# Patient Record
Sex: Male | Born: 1995 | Race: White | Hispanic: No | Marital: Single | Smoking: Never smoker
Health system: Southern US, Community
[De-identification: ages and names within clinical notes are randomized; demographics above are authoritative.]

---
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
## 2015-05-22 ENCOUNTER — Encounter: Payer: Self-pay | Admitting: *Deleted

## 2015-05-22 ENCOUNTER — Ambulatory Visit
Admission: EM | Admit: 2015-05-22 | Discharge: 2015-05-22 | Disposition: A | Discharge status: Home / Self Care | Payer: BC Managed Care – PPO | Attending: Family Medicine | Admitting: Family Medicine

## 2015-05-22 DIAGNOSIS — J029 Acute pharyngitis, unspecified: Secondary | ICD-10-CM

## 2015-05-22 LAB — MONONUCLEOSIS SCREEN: Mono Screen: NEGATIVE

## 2015-05-22 LAB — CBC WITH DIFFERENTIAL/PLATELET
BASOS ABS: 0.1 10*3/uL (ref 0–0.1)
Basophils Relative: 1 %
Eosinophils Absolute: 0.1 10*3/uL (ref 0–0.7)
Eosinophils Relative: 2 %
HEMATOCRIT: 46.5 % (ref 40.0–52.0)
HEMOGLOBIN: 15.8 g/dL (ref 13.0–18.0)
LYMPHS PCT: 34 %
Lymphs Abs: 2 10*3/uL (ref 1.0–3.6)
MCH: 30.1 pg (ref 26.0–34.0)
MCHC: 34.1 g/dL (ref 32.0–36.0)
MCV: 88.5 fL (ref 80.0–100.0)
MONO ABS: 1.3 10*3/uL — AB (ref 0.2–1.0)
MONOS PCT: 23 %
NEUTROS ABS: 2.3 10*3/uL (ref 1.4–6.5)
NEUTROS PCT: 40 %
Platelets: 194 10*3/uL (ref 150–440)
RBC: 5.25 MIL/uL (ref 4.40–5.90)
RDW: 13.2 % (ref 11.5–14.5)
WBC: 5.7 10*3/uL (ref 3.8–10.6)

## 2015-05-22 LAB — RAPID INFLUENZA A&B ANTIGENS
Influenza A (ARMC): NOT DETECTED
Influenza B (ARMC): NOT DETECTED

## 2015-05-22 LAB — RAPID STREP SCREEN (MED CTR MEBANE ONLY): STREPTOCOCCUS, GROUP A SCREEN (DIRECT): NEGATIVE

## 2015-05-22 MED ORDER — OSELTAMIVIR PHOSPHATE 75 MG PO CAPS: 75.0000 mg | ORAL_CAPSULE | Freq: Two times a day (BID) | ORAL | Status: DC

## 2015-05-22 NOTE — ED Provider Notes (Signed)
CSN: [id number]     Arrival date & time 05/22/15  1042 History   First MD Initiated Contact with Patient 05/22/15 1152     Chief Complaint  Patient presents with  . Sore Throat  . Headache   (Consider location/radiation/quality/duration/timing/severity/associated sxs/prior Treatment) HPI: Patient is unsafe symptoms of sore throat and headache. Patient states that he had a fever yesterday of 102. He also has some myalgias. He denies any cough, chest pain, shortness of breath, abdominal pain. He denies any recent travel. He has not taken any medications today and is afebrile in the office. He denies any nausea vomiting or diarrhea or rashes.  History reviewed. No pertinent past medical history. History reviewed. No pertinent past surgical history. History reviewed. No pertinent family history. Social History  Substance Use Topics  . Smoking status: Never Smoker   . Smokeless tobacco: Never Used  . Alcohol Use: No    Review of Systems: Negative except mentioned.   Allergies  Review of patient's allergies indicates no known allergies.  Home Medications   Prior to Admission medications   Not on File   Meds Ordered and Administered this Visit  Medications - No data to display  BP 131/83 mmHg  Pulse 57  Temp(Src) 98.2 F (36.8 C) (Oral)  Resp 18  Ht  (1.727 m)  Wt 138 lb (62.596 kg)  BMI 20.99 kg/m2  SpO2 100% No data found.   Physical Exam   GENERAL: NAD HEENT: mild pharyngeal erythema, no exudate, no erythema of TMs, mild cervical LAD RESP: CTA B CARD: RRR ABD: +BS, NT/ND, no rebound or guarding NEURO: CN II-XII grossly intact   ED Course  Procedures (including critical care time)  Labs Review Labs Reviewed  RAPID INFLUENZA A&B ANTIGENS ([facility] ONLY)  RAPID STREP SCREEN (NOT AT [facility])  CULTURE, GROUP A STREP ([facility])  CBC WITH DIFFERENTIAL/PLATELET  MONONUCLEOSIS SCREEN    Imaging Review No results found.    MDM  A/P: Pharyngitis, Viral Illness-  rapid strep test was negative, flu screen was negative, mono test was negative, CBC appears to show likely viral process. We'll send him a flu to the pharmacy only if patient has symptoms this evening should he start that prescription. Tylenol/Motrin when necessary, Claritin when necessary, rest, hydration, he can return to work tomorrow as long as he doesn't have fever. Seek medical attention if symptoms persist or worsen.    Jolene Provost, MD 05/22/15 1316

## 2015-05-22 NOTE — ED Notes (Signed)
Patient started having symptoms of sore throat and headache yesterday. Additional symptoms of fever and body aches are present.

## 2015-05-24 LAB — CULTURE, GROUP A STREP (THRC)

## 2016-07-03 ENCOUNTER — Emergency Department
Admission: EM | Admit: 2016-07-03 | Discharge: 2016-07-03 | Disposition: A | Discharge status: Home / Self Care | Payer: BC Managed Care – PPO | Attending: Emergency Medicine | Admitting: Emergency Medicine

## 2016-07-03 ENCOUNTER — Encounter: Payer: Self-pay | Admitting: Emergency Medicine

## 2016-07-03 ENCOUNTER — Emergency Department: Payer: BC Managed Care – PPO

## 2016-07-03 DIAGNOSIS — R109 Unspecified abdominal pain: Secondary | ICD-10-CM | POA: Diagnosis present

## 2016-07-03 DIAGNOSIS — N201 Calculus of ureter: Secondary | ICD-10-CM | POA: Diagnosis not present

## 2016-07-03 LAB — COMPREHENSIVE METABOLIC PANEL
ALT: 15 U/L — AB (ref 17–63)
AST: 22 U/L (ref 15–41)
Albumin: 5.1 g/dL — ABNORMAL HIGH (ref 3.5–5.0)
Alkaline Phosphatase: 38 U/L (ref 38–126)
Anion gap: 6 (ref 5–15)
BUN: 15 mg/dL (ref 6–20)
CHLORIDE: 104 mmol/L (ref 101–111)
CO2: 31 mmol/L (ref 22–32)
CREATININE: 0.84 mg/dL (ref 0.61–1.24)
Calcium: 9.6 mg/dL (ref 8.9–10.3)
GFR calc Af Amer: >60 mL/min (ref >60)
Glucose, Bld: 92 mg/dL (ref 65–99)
POTASSIUM: 4.2 mmol/L (ref 3.5–5.1)
SODIUM: 141 mmol/L (ref 135–145)
Total Bilirubin: 1.1 mg/dL (ref 0.3–1.2)
Total Protein: 7.6 g/dL (ref 6.5–8.1)

## 2016-07-03 LAB — URINALYSIS, COMPLETE (UACMP) WITH MICROSCOPIC
BACTERIA UA: NONE SEEN
BILIRUBIN URINE: NEGATIVE
Glucose, UA: NEGATIVE mg/dL
HGB URINE DIPSTICK: NEGATIVE
KETONES UR: NEGATIVE mg/dL
LEUKOCYTES UA: NEGATIVE
Nitrite: NEGATIVE
PROTEIN: NEGATIVE mg/dL
SQUAMOUS EPITHELIAL / LPF: NONE SEEN
Specific Gravity, Urine: 1.021 (ref 1.005–1.030)
pH: 6 (ref 5.0–8.0)

## 2016-07-03 LAB — CBC
HCT: 46 % (ref 40.0–52.0)
Hemoglobin: 16.1 g/dL (ref 13.0–18.0)
MCH: 30.7 pg (ref 26.0–34.0)
MCHC: 35.1 g/dL (ref 32.0–36.0)
MCV: 87.4 fL (ref 80.0–100.0)
PLATELETS: 222 10*3/uL (ref 150–440)
RBC: 5.26 MIL/uL (ref 4.40–5.90)
RDW: 13.1 % (ref 11.5–14.5)
WBC: 14 10*3/uL — AB (ref 3.8–10.6)

## 2016-07-03 LAB — LIPASE, BLOOD: LIPASE: 12 U/L (ref 11–51)

## 2016-07-03 MED ORDER — NAPROXEN 500 MG PO TABS: 500.0000 mg | ORAL_TABLET | Freq: Two times a day (BID) | ORAL | 2 refills | Status: DC

## 2016-07-03 MED ORDER — SODIUM CHLORIDE 0.9 % IV SOLN
1000.0000 mL | Freq: Once | INTRAVENOUS | Status: AC
  Administered 2016-07-03: 1000 mL via INTRAVENOUS

## 2016-07-03 MED ORDER — KETOROLAC TROMETHAMINE 30 MG/ML IJ SOLN
30.0000 mg | Freq: Once | INTRAMUSCULAR | Status: AC
  Administered 2016-07-03: 30 mg via INTRAVENOUS
  Filled 2016-07-03: qty 1

## 2016-07-03 NOTE — ED Triage Notes (Signed)
[facility]. Pt c/o left flank pain that started around 0930 this morning.  Pt states pain also in abdomen but unable to narrow down location.  Denies any dysuria or hematuria.  Reports amber colored urine. 10/10 pain.

## 2016-07-03 NOTE — ED Notes (Signed)
ED Provider at bedside. 

## 2016-07-03 NOTE — ED Provider Notes (Signed)
[facility] Emergency Department Provider Note   ____________________________________________    I have reviewed the triage vital signs and the nursing notes.   HISTORY  Chief Complaint Flank Pain     HPI Mark Turner is a 21 y.o. male who presents with complaints of left flank pain. Patient reports that approximately 9:30 this morning he developed dull left flank pain which quickly became very sharp and extremely painful. He was unable to sit still and had to stand up and notes that he was sweaty and nauseous. He has had this once before several months ago but it went away on its own. No history of kidney stones. No fevers or chills. No dysuria. He reports the pain is still there but not as severe. Father has a history of kidney stones   History reviewed. No pertinent past medical history.  There are no active problems to display for this patient.   History reviewed. No pertinent surgical history.  Prior to Admission medications   Medication Sig Start Date End Date Taking? Authorizing Provider  naproxen (NAPROSYN) 500 MG tablet Take 1 tablet (500 mg total) by mouth 2 (two) times daily with a meal. 07/03/16   Jene Everyobert Alvia Tory, MD  oseltamivir (TAMIFLU) 75 MG capsule Take 1 capsule (75 mg total) by mouth every 12 (twelve) hours. 05/22/15   Jolene ProvostKirtida Patel, MD     Allergies Patient has no known allergies.  History reviewed. No pertinent family history.  Social History Social History  Substance Use Topics  . Smoking status: Never Smoker  . Smokeless tobacco: Never Used  . Alcohol use No    Review of Systems  Constitutional: No fever/chills   Cardiovascular: Denies chest pain. Respiratory: Denies shortness of breath. Gastrointestinal: As above Genitourinary: Negative for dysuria. Has not noticed blood Musculoskeletal: Negative for back pain. Skin: Negative for rash. Neurological: Negative for headache  10-point ROS otherwise  negative.  ____________________________________________   PHYSICAL EXAM:  VITAL SIGNS: ED Triage Vitals  Enc Vitals Group     BP 07/03/16 1421 140/85     Pulse Rate 07/03/16 1421 66     Resp 07/03/16 1421 18     Temp 07/03/16 1421 98.3 F (36.8 C)     Temp Source 07/03/16 1421 Oral     SpO2 07/03/16 1421 99 %     Weight 07/03/16 1421 138 lb (62.6 kg)     Height 07/03/16 1421 5\' 7"  (1.702 m)     Head Circumference --      Peak Flow --      Pain Score 07/03/16 1422 10     Pain Loc --      Pain Edu? --      Excl. in GC? --     Constitutional: Alert and oriented. No acute distress. Pleasant and interactive Eyes: Conjunctivae are normal.   Nose: No congestion/rhinnorhea. Mouth/Throat: Mucous membranes are moist.    Cardiovascular: Normal rate, regular rhythm. Grossly normal heart sounds.  Good peripheral circulation. Respiratory: Normal respiratory effort.  No retractions. Lungs CTAB. Gastrointestinal: Soft and nontender. No distention.  No CVA tenderness. Genitourinary: deferred Musculoskeletal: No lower extremity tenderness nor edema.  Warm and well perfused Neurologic:  Normal speech and language. No gross focal neurologic deficits are appreciated.  Skin:  Skin is warm, dry and intact. No rash noted. Psychiatric: Mood and affect are normal. Speech and behavior are normal.  ____________________________________________   LABS (all labs ordered are listed, but only abnormal results are displayed)  Labs Reviewed  URINALYSIS, COMPLETE (UACMP) WITH MICROSCOPIC - Abnormal; Notable for the following:       Result Value   Color, Urine YELLOW (*)    APPearance HAZY (*)    All other components within normal limits  CBC - Abnormal; Notable for the following:    WBC 14.0 (*)    All other components within normal limits  COMPREHENSIVE METABOLIC PANEL - Abnormal; Notable for the following:    Albumin 5.1 (*)    ALT 15 (*)    All other components within normal limits   LIPASE, BLOOD   ____________________________________________  EKG  None ____________________________________________  RADIOLOGY  CT renal stone study reassuring, some enlarged lymph nodes noted ____________________________________________   PROCEDURES  Procedure(s) performed: No    Critical Care performed: No ____________________________________________   INITIAL IMPRESSION / ASSESSMENT AND PLAN / ED COURSE  Pertinent labs & imaging results that were available during my care of the patient were reviewed by me and considered in my medical decision making (see chart for details).  Patient well-appearing and in no acute distress. Complains of acute onset left flank pain, strongly suspicious for kidney stone. We will check urine labs CT renal stone study and given IV Toradol and IV fluids  ----------------------------------------- 4:27 PM on 07/03/2016 -----------------------------------------  Patient feels better after treatment, pain is greatly relieved. CT scan does not demonstrate stones, mildly enlarged lymph nodes, question whether patient passed stone already as history is strongly suggestive of ureterolithiasis. Discussed CT results with family, we will treat with naproxen and recommend PCP follow-up    ____________________________________________   FINAL CLINICAL IMPRESSION(S) / ED DIAGNOSES  Final diagnoses:  Acute left flank pain  Ureterolithiasis      NEW MEDICATIONS STARTED DURING THIS VISIT:  New Prescriptions   NAPROXEN (NAPROSYN) 500 MG TABLET    Take 1 tablet (500 mg total) by mouth 2 (two) times daily with a meal.     Note:  This document was prepared using Dragon voice recognition software and may include unintentional dictation errors.    Jene Every, MD 07/03/16 [phone]

## 2020-09-30 ENCOUNTER — Emergency Department: Payer: Medicaid Other

## 2020-09-30 ENCOUNTER — Observation Stay
Admission: EM | Admit: 2020-09-30 | Discharge: 2020-10-02 | Disposition: A | Discharge status: Home / Self Care | Payer: Medicaid Other | Attending: Surgery | Admitting: Surgery

## 2020-09-30 ENCOUNTER — Other Ambulatory Visit: Payer: Self-pay

## 2020-09-30 DIAGNOSIS — F17220 Nicotine dependence, chewing tobacco, uncomplicated: Secondary | ICD-10-CM | POA: Insufficient documentation

## 2020-09-30 DIAGNOSIS — S99912A Unspecified injury of left ankle, initial encounter: Secondary | ICD-10-CM | POA: Diagnosis present

## 2020-09-30 DIAGNOSIS — Y92096 Garden or yard of other non-institutional residence as the place of occurrence of the external cause: Secondary | ICD-10-CM | POA: Insufficient documentation

## 2020-09-30 DIAGNOSIS — W1842XA Slipping, tripping and stumbling without falling due to stepping into hole or opening, initial encounter: Secondary | ICD-10-CM | POA: Insufficient documentation

## 2020-09-30 DIAGNOSIS — S82852A Displaced trimalleolar fracture of left lower leg, initial encounter for closed fracture: Secondary | ICD-10-CM | POA: Diagnosis not present

## 2020-09-30 DIAGNOSIS — Z20822 Contact with and (suspected) exposure to covid-19: Secondary | ICD-10-CM | POA: Insufficient documentation

## 2020-09-30 DIAGNOSIS — Z419 Encounter for procedure for purposes other than remedying health state, unspecified: Secondary | ICD-10-CM

## 2020-09-30 DIAGNOSIS — S9305XA Dislocation of left ankle joint, initial encounter: Secondary | ICD-10-CM

## 2020-09-30 DIAGNOSIS — S82892A Other fracture of left lower leg, initial encounter for closed fracture: Secondary | ICD-10-CM | POA: Diagnosis present

## 2020-09-30 MED ORDER — HYDROMORPHONE HCL 1 MG/ML IJ SOLN
1.0000 mg | Freq: Once | INTRAMUSCULAR | Status: AC
  Administered 2020-09-30: 1 mg via INTRAVENOUS
  Filled 2020-09-30: qty 1

## 2020-09-30 MED ORDER — LACTATED RINGERS IV BOLUS
1000.0000 mL | Freq: Once | INTRAVENOUS | Status: AC
  Administered 2020-10-01: 1000 mL via INTRAVENOUS

## 2020-09-30 MED ORDER — ETOMIDATE 2 MG/ML IV SOLN
10.0000 mg | Freq: Once | INTRAVENOUS | Status: AC
  Administered 2020-10-01: 10 mg via INTRAVENOUS
  Filled 2020-09-30: qty 10

## 2020-09-30 NOTE — ED Triage Notes (Signed)
Pt presents to ER via ems from home.  Pt was reportedly walking and stepped in a hole and fell.  When pt fell, he felt his ankle pop.  Pt has obvious deformity to left ankle noted.  Pt given 200 mcg fentanyl and 4 mg zofran en route.  Pt A&O x4 at this time.

## 2020-09-30 NOTE — ED Provider Notes (Signed)
[facility] Emergency Department Provider Note   ____________________________________________   Event Date/Time   First MD Initiated Contact with Patient 09/30/20 2335     (approximate)  I have reviewed the triage vital signs and the nursing notes.   HISTORY  Chief Complaint Ankle Pain (Left ankle deformity )    HPI Mark Turner is a 25 y.o. male brought to the ED via EMS from home status post left ankle injury.  Patient was chasing a possum in his yard and stepped in a hole, twisting his left ankle.  Presents with obvious deformity to left ankle with palpable pulses.  Given 200 mcg IV fentanyl and 4 mg IV Zofran in route to the ED by EMS.  Denies striking head or LOC.  Denies neck pain, chest pain, shortness of breath, abdominal pain, nausea, vomiting or dizziness.  Last ate dinner at 6:30 PM.     Past medical history Asthma  Patient Active Problem List   Diagnosis Date Noted   Ankle fracture, left 10/01/2020     No past surgical history on file.  Prior to Admission medications   Medication Sig Start Date End Date Taking? Authorizing Provider  Loratadine 10 MG CAPS Take 10 mg by mouth daily.    [provider]    Allergies Patient has no known allergies.  No family history on file.  Social History Social History   Tobacco Use   Smoking status: Never   Smokeless tobacco: Never  Substance Use Topics   Alcohol use: No   Drug use: No    Review of Systems  Constitutional: No fever/chills Eyes: No visual changes. ENT: No sore throat. Cardiovascular: Denies chest pain. Respiratory: Denies shortness of breath. Gastrointestinal: No abdominal pain.  No nausea, no vomiting.  No diarrhea.  No constipation. Genitourinary: Negative for dysuria. Musculoskeletal: Positive for left ankle injury.  Negative for back pain. Skin: Negative for rash. Neurological: Negative for headaches, focal weakness or  numbness.   ____________________________________________   PHYSICAL EXAM:  VITAL SIGNS: ED Triage Vitals  Enc Vitals Group     BP      Pulse      Resp      Temp      Temp src      SpO2      Weight      Height      Head Circumference      Peak Flow      Pain Score      Pain Loc      Pain Edu?      Excl. in GC?     Constitutional: Alert and oriented. Well appearing and in mild acute distress. Eyes: Conjunctivae are normal. PERRL. EOMI. Head: Atraumatic. Nose: Atraumatic. Mouth/Throat: Mucous membranes are moist.  No dental malocclusion. Neck: No stridor.   Cardiovascular: Normal rate, regular rhythm. Grossly normal heart sounds.  Good peripheral circulation. Respiratory: Normal respiratory effort.  No retractions. Lungs CTAB. Gastrointestinal: Soft and nontender to light or deep palpation. No distention. No abdominal bruits. No CVA tenderness. Musculoskeletal:  LLE: Obvious ankle deformity.  Palpable DP pulses.  Warm foot without evidence for ischemia.  Able to wiggle toes.  Brisk, less than 5-second capillary refill. Neurologic:  Normal speech and language. No gross focal neurologic deficits are appreciated.  Skin:  Skin is warm, dry and intact. No rash noted. Psychiatric: Mood and affect are normal. Speech and behavior are normal.  ____________________________________________   LABS (all labs ordered are  listed, but only abnormal results are displayed)  Labs Reviewed  CBC WITH DIFFERENTIAL/PLATELET - Abnormal; Notable for the following components:      Result Value   WBC 11.0 (*)    Neutro Abs 8.1 (*)    All other components within normal limits  BASIC METABOLIC PANEL - Abnormal; Notable for the following components:   Glucose, Bld 119 (*)    Calcium 8.5 (*)    All other components within normal limits  RESP PANEL BY RT-PCR (FLU A&B, COVID) ARPGX2  HIV ANTIBODY (ROUTINE TESTING W REFLEX)    ____________________________________________  EKG  None ____________________________________________  RADIOLOGY Tawni MillersI, Takeyla Million J, personally viewed and evaluated these images (plain radiographs) as part of my medical decision making, as well as reviewing the written report by the radiologist.  ED MD interpretation: Left ankle fracture dislocation; improved interval reduction  Official radiology report(s): DG Ankle 2 Views Left  Result Date: 10/01/2020 CLINICAL DATA:  Post reduction EXAM: LEFT ANKLE - 2 VIEW COMPARISON:  X-ray left ankle 09/30/2020 FINDINGS: Status post cast placement with improved anatomical alignment of a trimalleolar fracture. Interval reduction of the left ankle joint with proper alignment of the tibiotalar joint and tibia fibular joint. Persistent widening of the medial clear space. Subcutaneus soft tissue edema. IMPRESSION: Improved anatomical alignment of a trimalleolar fracture. Interval reduction of the left ankle joint with proper alignment of the tibiotalar joint and tibia-fibular joint but persistent widening of the medial clear space. Electronically Signed   By: Tish FredericksonMorgane  Naveau M.D.   On: 10/01/2020 01:07   DG Ankle Complete Left  Result Date: 09/30/2020 CLINICAL DATA:  Larey SeatFell into a hole.  Felt a pop.  Deformity. EXAM: LEFT ANKLE COMPLETE - 3+ VIEW COMPARISON:  None. FINDINGS: Lateral dislocation of the talus with respect to the tibia. There may also be a rotational component. Displaced medial malleolar fracture with distal fracture fragment aligned with the displaced talus. There is an angulated distal fibular shaft fracture. Disruption of the ankle mortise and syndesmosis. No visualized tibial tubercle fracture, however significant osseous overlap. Generalized soft tissue edema at the fracture site. IMPRESSION: Ankle fracture dislocation. Displaced medial malleolar and distal fibular fractures with disruption of the ankle mortise and syndesmosis. Lateral dislocation  of the talus with respect to the tibia. Electronically Signed   By: Narda RutherfordMelanie  Sanford M.D.   On: 09/30/2020 23:52    ____________________________________________   PROCEDURES  Procedure(s) performed (including Critical Care):  .Ortho Injury Treatment  Date/Time: 09/30/2020 11:47 PM Performed by: Irean HongSung, Arwyn Besaw J, MD Authorized by: Irean HongSung, Deisha Stull J, MD   Consent:    Consent obtained:  Verbal   Consent given by:  Patient   Risks discussed:  Fracture, nerve damage, restricted joint movement, vascular damage, stiffness, recurrent dislocation and irreducible dislocation   Alternatives discussed:  ImmobilizationInjury location: ankle Location details: left ankle Injury type: fracture-dislocation Pre-procedure neurovascular assessment: neurovascularly intact Pre-procedure range of motion: reduced  Patient sedated: Yes. Refer to sedation procedure documentation for details of sedation. Manipulation performed: yes Skin traction used: no Skeletal traction used: no Immobilization: splint Splint type: ankle stirrup and long leg Splint Applied by: ED Provider and [facility] [employer] Supplies used: cotton padding, elastic bandage and Ortho-Glass Post-procedure neurovascular assessment: post-procedure neurovascularly intact   .Sedation  Date/Time: 09/30/2020 11:49 PM Performed by: Irean HongSung, Kendalynn Wideman J, MD Authorized by: Irean HongSung, Ardenia Stiner J, MD   Consent:    Consent obtained:  Verbal   Consent given by:  Patient   Risks discussed:  Allergic reaction,  prolonged hypoxia resulting in organ damage, prolonged sedation necessitating reversal, dysrhythmia, inadequate sedation, respiratory compromise necessitating ventilatory assistance and intubation, nausea and vomiting   Alternatives discussed:  Analgesia without sedation Universal protocol:    Procedure explained and questions answered to patient or proxy's satisfaction: yes     Relevant documents present and verified: yes     Test results available: yes     Imaging  studies available: yes     Required blood products, implants, devices, and special equipment available: yes     Site/side marked: yes     Immediately prior to procedure, a time out was called: yes     Patient identity confirmed:  Verbally with patient and arm band Indications:    Procedure performed:  Dislocation reduction   Procedure necessitating sedation performed by:  Physician performing sedation Pre-sedation assessment:    Time since last food or drink:  1830   NPO status caution: urgency dictates proceeding with non-ideal NPO status     ASA classification: class 1 - normal, healthy patient     Mouth opening:  3 or more finger widths   Thyromental distance:  4 finger widths   Mallampati score:  I - soft palate, uvula, fauces, pillars visible   Neck mobility: normal     Pre-sedation assessments completed and reviewed: airway patency, cardiovascular function, hydration status, mental status, nausea/vomiting, pain level, respiratory function and temperature   Immediate pre-procedure details:    Reassessment: Patient reassessed immediately prior to procedure     Reviewed: vital signs, relevant labs/tests and NPO status     Verified: bag valve mask available, emergency equipment available, intubation equipment available, IV patency confirmed, oxygen available, reversal medications available and suction available   Procedure details (see MAR for exact dosages):    Preoxygenation:  Nasal cannula   Sedation:  Etomidate   Intended level of sedation: deep   Analgesia:  Hydromorphone   Intra-procedure monitoring:  Blood pressure monitoring, continuous capnometry, frequent LOC assessments, frequent vital sign checks, continuous pulse oximetry and cardiac monitor   Intra-procedure events: none     Total Provider sedation time (minutes):  29 Post-procedure details:    Post-sedation assessment completed:  10/01/2020 1:15 AM   Attendance: Constant attendance by certified staff until patient  recovered     Recovery: Patient returned to pre-procedure baseline     Patient is stable for discharge or admission: yes     Procedure completion:  Tolerated well, no immediate complications .Splint Application  Date/Time: 10/01/2020 12:40 AM Performed by: Irean Hong, MD Authorized by: Irean Hong, MD   Consent:    Consent obtained:  Verbal   Consent given by:  Patient   Risks, benefits, and alternatives were discussed: yes     Risks discussed:  Discoloration, numbness, pain and swelling Universal protocol:    Patient identity confirmed:  Verbally with patient Pre-procedure details:    Distal neurologic exam:  Normal   Distal perfusion: distal pulses strong   Procedure details:    Location:  Ankle   Splint type:  Long leg and ankle stirrup   Supplies:  Cotton padding, fiberglass and elastic bandage   Attestation: Splint applied and adjusted personally by me   Post-procedure details:    Distal neurologic exam:  Normal   Distal perfusion: distal pulses strong     Procedure completion:  Tolerated   Post-procedure imaging: reviewed     CRITICAL CARE Performed by: Irean Hong   Total critical care time: 45  minutes  Critical care time was exclusive of separately billable procedures and treating other patients.  Critical care was necessary to treat or prevent imminent or life-threatening deterioration.  Critical care was time spent personally by me on the following activities: development of treatment plan with patient and/or surrogate as well as nursing, discussions with consultants, evaluation of patient's response to treatment, examination of patient, obtaining history from patient or surrogate, ordering and performing treatments and interventions, ordering and review of laboratory studies, ordering and review of radiographic studies, pulse oximetry and re-evaluation of patient's condition.  ____________________________________________   INITIAL IMPRESSION / ASSESSMENT  AND PLAN / ED COURSE  As part of my medical decision making, I reviewed the following data within the electronic MEDICAL RECORD NUMBER History obtained from family, Nursing notes reviewed and incorporated, Labs reviewed, Old chart reviewed, Radiograph reviewed, Discussed with admitting physician, and Notes from prior ED visits     25 year old male presenting with left ankle injury.  Differential diagnosis includes but is not limited to dislocation, fracture, internal derangement, etc.  Will obtain plain film x-rays.  Obvious dislocation.  Will set up for reduction under deep sedation.  Clinical Course as of 10/01/20 0641  Tue Oct 01, 2020  0045 Patient tolerated deep sedation and reduction of fracture/dislocation well.  Will discuss with orthopedics [JS]  [phone] Discussed with orthopedics on-call Dr. Joice Lofts who will admit patient for surgery later this afternoon.  Updated patient and spouse who are agreeable with plan of care. [JS]    Clinical Course User Index [JS] Irean Hong, MD     ____________________________________________   FINAL CLINICAL IMPRESSION(S) / ED DIAGNOSES  Final diagnoses:  Closed fracture of left ankle, initial encounter  Ankle dislocation, left, initial encounter     ED Discharge Orders     None        Note:  This document was prepared using Dragon voice recognition software and may include unintentional dictation errors.    Irean Hong, MD 10/01/20 [phone]

## 2020-10-01 ENCOUNTER — Emergency Department: Payer: Medicaid Other

## 2020-10-01 ENCOUNTER — Observation Stay: Payer: Medicaid Other | Admitting: Anesthesiology

## 2020-10-01 ENCOUNTER — Observation Stay: Payer: Medicaid Other

## 2020-10-01 ENCOUNTER — Encounter: Payer: Self-pay | Admitting: Surgery

## 2020-10-01 ENCOUNTER — Encounter
Admission: EM | Disposition: A | Discharge status: Home / Self Care | Payer: Self-pay | Source: Home / Self Care | Attending: Emergency Medicine

## 2020-10-01 DIAGNOSIS — S82892A Other fracture of left lower leg, initial encounter for closed fracture: Secondary | ICD-10-CM | POA: Diagnosis present

## 2020-10-01 HISTORY — PX: ORIF ANKLE FRACTURE: SHX5408

## 2020-10-01 LAB — CBC WITH DIFFERENTIAL/PLATELET
Abs Immature Granulocytes: 0.03 10*3/uL (ref 0.00–0.07)
Basophils Absolute: 0.1 10*3/uL (ref 0.0–0.1)
Basophils Relative: 1 %
Eosinophils Absolute: 0.1 10*3/uL (ref 0.0–0.5)
Eosinophils Relative: 1 %
HCT: 41.4 % (ref 39.0–52.0)
Hemoglobin: 14.2 g/dL (ref 13.0–17.0)
Immature Granulocytes: 0 %
Lymphocytes Relative: 18 %
Lymphs Abs: 2 10*3/uL (ref 0.7–4.0)
MCH: 30.3 pg (ref 26.0–34.0)
MCHC: 34.3 g/dL (ref 30.0–36.0)
MCV: 88.3 fL (ref 80.0–100.0)
Monocytes Absolute: 0.7 10*3/uL (ref 0.1–1.0)
Monocytes Relative: 7 %
Neutro Abs: 8.1 10*3/uL — ABNORMAL HIGH (ref 1.7–7.7)
Neutrophils Relative %: 73 %
Platelets: 218 10*3/uL (ref 150–400)
RBC: 4.69 MIL/uL (ref 4.22–5.81)
RDW: 13 % (ref 11.5–15.5)
WBC: 11 10*3/uL — ABNORMAL HIGH (ref 4.0–10.5)
nRBC: 0 % (ref 0.0–0.2)

## 2020-10-01 LAB — BASIC METABOLIC PANEL
Anion gap: 6 (ref 5–15)
BUN: 16 mg/dL (ref 6–20)
CO2: 30 mmol/L (ref 22–32)
Calcium: 8.5 mg/dL — ABNORMAL LOW (ref 8.9–10.3)
Chloride: 104 mmol/L (ref 98–111)
Creatinine, Ser: 0.98 mg/dL (ref 0.61–1.24)
GFR, Estimated: >60 mL/min (ref >60)
Glucose, Bld: 119 mg/dL — ABNORMAL HIGH (ref 70–99)
Potassium: 4.4 mmol/L (ref 3.5–5.1)
Sodium: 140 mmol/L (ref 135–145)

## 2020-10-01 LAB — RESP PANEL BY RT-PCR (FLU A&B, COVID) ARPGX2
Influenza A by PCR: NEGATIVE
Influenza B by PCR: NEGATIVE
SARS Coronavirus 2 by RT PCR: NEGATIVE

## 2020-10-01 SURGERY — OPEN REDUCTION INTERNAL FIXATION (ORIF) ANKLE FRACTURE
Anesthesia: Regional | Site: Ankle | Laterality: Left

## 2020-10-01 MED ORDER — KETOROLAC TROMETHAMINE 15 MG/ML IJ SOLN
7.5000 mg | Freq: Four times a day (QID) | INTRAMUSCULAR | Status: DC
  Administered 2020-10-01 – 2020-10-02 (×3): 7.5 mg via INTRAVENOUS
  Filled 2020-10-01 (×3): qty 1

## 2020-10-01 MED ORDER — CEFAZOLIN SODIUM-DEXTROSE 2-4 GM/100ML-% IV SOLN
2.0000 g | Freq: Four times a day (QID) | INTRAVENOUS | Status: AC
  Administered 2020-10-01 – 2020-10-02 (×3): 2 g via INTRAVENOUS
  Filled 2020-10-01 (×6): qty 100

## 2020-10-01 MED ORDER — SODIUM CHLORIDE 0.9 % IV SOLN: INTRAVENOUS | Status: DC

## 2020-10-01 MED ORDER — MIDAZOLAM HCL 2 MG/2ML IJ SOLN: 2.0000 mg | Freq: Once | INTRAMUSCULAR | Status: AC

## 2020-10-01 MED ORDER — DEXAMETHASONE SODIUM PHOSPHATE 10 MG/ML IJ SOLN
INTRAMUSCULAR | Status: DC | PRN
  Administered 2020-10-01: 10 mg via INTRAVENOUS

## 2020-10-01 MED ORDER — ONDANSETRON HCL 4 MG/2ML IJ SOLN: 4.0000 mg | Freq: Four times a day (QID) | INTRAMUSCULAR | Status: DC | PRN

## 2020-10-01 MED ORDER — METOCLOPRAMIDE HCL 5 MG/ML IJ SOLN: 5.0000 mg | Freq: Three times a day (TID) | INTRAMUSCULAR | Status: DC | PRN

## 2020-10-01 MED ORDER — FLEET ENEMA 7-19 GM/118ML RE ENEM: 1.0000 | ENEMA | Freq: Once | RECTAL | Status: DC | PRN

## 2020-10-01 MED ORDER — NICOTINE 21 MG/24HR TD PT24
21.0000 mg | MEDICATED_PATCH | Freq: Once | TRANSDERMAL | Status: AC
  Administered 2020-10-01: 21 mg via TRANSDERMAL
  Filled 2020-10-01: qty 1

## 2020-10-01 MED ORDER — LORATADINE 10 MG PO TABS
10.0000 mg | ORAL_TABLET | Freq: Every day | ORAL | Status: DC
  Administered 2020-10-02: 10 mg via ORAL
  Filled 2020-10-01: qty 1

## 2020-10-01 MED ORDER — DOCUSATE SODIUM 100 MG PO CAPS: 100.0000 mg | ORAL_CAPSULE | Freq: Two times a day (BID) | ORAL | Status: DC

## 2020-10-01 MED ORDER — FENTANYL CITRATE (PF) 100 MCG/2ML IJ SOLN
50.0000 ug | INTRAMUSCULAR | Status: DC | PRN
Start: 2020-10-01 — End: 2020-10-01

## 2020-10-01 MED ORDER — POTASSIUM CHLORIDE IN NACL 20-0.9 MEQ/L-% IV SOLN
INTRAVENOUS | Status: DC
  Filled 2020-10-01 (×3): qty 1000

## 2020-10-01 MED ORDER — MAGNESIUM HYDROXIDE 400 MG/5ML PO SUSP: 30.0000 mL | Freq: Every day | ORAL | Status: DC | PRN

## 2020-10-01 MED ORDER — ACETAMINOPHEN 500 MG PO TABS
1000.0000 mg | ORAL_TABLET | Freq: Four times a day (QID) | ORAL | Status: DC
  Administered 2020-10-02 (×3): 1000 mg via ORAL
  Filled 2020-10-01 (×3): qty 2

## 2020-10-01 MED ORDER — HYDROMORPHONE HCL 1 MG/ML IJ SOLN
INTRAMUSCULAR | Status: AC
  Filled 2020-10-01: qty 0.5

## 2020-10-01 MED ORDER — OXYCODONE HCL 5 MG PO TABS: 5.0000 mg | ORAL_TABLET | ORAL | Status: DC | PRN

## 2020-10-01 MED ORDER — CEFAZOLIN SODIUM-DEXTROSE 2-3 GM-%(50ML) IV SOLR
INTRAVENOUS | Status: DC | PRN
  Administered 2020-10-01: 2 g via INTRAVENOUS

## 2020-10-01 MED ORDER — BISACODYL 10 MG RE SUPP: 10.0000 mg | Freq: Every day | RECTAL | Status: DC | PRN

## 2020-10-01 MED ORDER — MIDAZOLAM HCL 2 MG/2ML IJ SOLN
INTRAMUSCULAR | Status: DC | PRN
  Administered 2020-10-01: 2 mg via INTRAVENOUS

## 2020-10-01 MED ORDER — ACETAMINOPHEN 325 MG PO TABS: 650.0000 mg | ORAL_TABLET | Freq: Four times a day (QID) | ORAL | Status: DC | PRN

## 2020-10-01 MED ORDER — ONDANSETRON HCL 4 MG/2ML IJ SOLN
INTRAMUSCULAR | Status: DC | PRN
  Administered 2020-10-01: 4 mg via INTRAVENOUS

## 2020-10-01 MED ORDER — HYDROMORPHONE HCL 1 MG/ML IJ SOLN
INTRAMUSCULAR | Status: AC
  Filled 2020-10-01: qty 1

## 2020-10-01 MED ORDER — LIDOCAINE HCL (CARDIAC) PF 100 MG/5ML IV SOSY
PREFILLED_SYRINGE | INTRAVENOUS | Status: DC | PRN
  Administered 2020-10-01: 100 mg via INTRAVENOUS

## 2020-10-01 MED ORDER — DROPERIDOL 2.5 MG/ML IJ SOLN
0.6250 mg | Freq: Once | INTRAMUSCULAR | Status: DC | PRN
  Filled 2020-10-01: qty 0.25

## 2020-10-01 MED ORDER — FENTANYL CITRATE (PF) 100 MCG/2ML IJ SOLN
INTRAMUSCULAR | Status: AC
  Filled 2020-10-01: qty 4

## 2020-10-01 MED ORDER — BISACODYL 10 MG RE SUPP
10.0000 mg | Freq: Every day | RECTAL | Status: DC | PRN
  Filled 2020-10-01: qty 1

## 2020-10-01 MED ORDER — ONDANSETRON HCL 4 MG PO TABS: 4.0000 mg | ORAL_TABLET | Freq: Four times a day (QID) | ORAL | Status: DC | PRN

## 2020-10-01 MED ORDER — PROMETHAZINE HCL 25 MG/ML IJ SOLN: 6.2500 mg | INTRAMUSCULAR | Status: DC | PRN

## 2020-10-01 MED ORDER — ENOXAPARIN SODIUM 40 MG/0.4ML IJ SOSY
40.0000 mg | PREFILLED_SYRINGE | INTRAMUSCULAR | Status: DC
  Administered 2020-10-02: 40 mg via SUBCUTANEOUS
  Filled 2020-10-01: qty 0.4

## 2020-10-01 MED ORDER — CEFAZOLIN SODIUM-DEXTROSE 2-4 GM/100ML-% IV SOLN
INTRAVENOUS | Status: AC
  Filled 2020-10-01: qty 100

## 2020-10-01 MED ORDER — OXYCODONE HCL 5 MG PO TABS: 5.0000 mg | ORAL_TABLET | Freq: Once | ORAL | Status: DC | PRN

## 2020-10-01 MED ORDER — CHLORHEXIDINE GLUCONATE 0.12 % MT SOLN
OROMUCOSAL | Status: AC
  Administered 2020-10-01: 15 mL via OROMUCOSAL
  Filled 2020-10-01: qty 15

## 2020-10-01 MED ORDER — DOCUSATE SODIUM 100 MG PO CAPS
100.0000 mg | ORAL_CAPSULE | Freq: Two times a day (BID) | ORAL | Status: DC
  Administered 2020-10-01 – 2020-10-02 (×2): 100 mg via ORAL
  Filled 2020-10-01 (×2): qty 1

## 2020-10-01 MED ORDER — DEXAMETHASONE SODIUM PHOSPHATE 10 MG/ML IJ SOLN
INTRAMUSCULAR | Status: DC | PRN
  Administered 2020-10-01 (×2): 2 mg

## 2020-10-01 MED ORDER — OXYCODONE HCL 5 MG PO TABS
5.0000 mg | ORAL_TABLET | ORAL | Status: DC | PRN
  Administered 2020-10-01 – 2020-10-02 (×2): 5 mg via ORAL
  Filled 2020-10-01 (×2): qty 1

## 2020-10-01 MED ORDER — CEFAZOLIN SODIUM-DEXTROSE 2-4 GM/100ML-% IV SOLN
2.0000 g | Freq: Once | INTRAVENOUS | Status: DC
  Filled 2020-10-01: qty 100

## 2020-10-01 MED ORDER — CHLORHEXIDINE GLUCONATE 0.12 % MT SOLN: 15.0000 mL | Freq: Once | OROMUCOSAL | Status: AC

## 2020-10-01 MED ORDER — FENTANYL CITRATE (PF) 100 MCG/2ML IJ SOLN: 50.0000 ug | Freq: Once | INTRAMUSCULAR | Status: DC

## 2020-10-01 MED ORDER — METOCLOPRAMIDE HCL 10 MG PO TABS: 5.0000 mg | ORAL_TABLET | Freq: Three times a day (TID) | ORAL | Status: DC | PRN

## 2020-10-01 MED ORDER — ACETAMINOPHEN 650 MG RE SUPP: 650.0000 mg | Freq: Four times a day (QID) | RECTAL | Status: DC | PRN

## 2020-10-01 MED ORDER — DEXAMETHASONE SODIUM PHOSPHATE 10 MG/ML IJ SOLN
INTRAMUSCULAR | Status: AC
  Filled 2020-10-01: qty 1

## 2020-10-01 MED ORDER — FENTANYL CITRATE (PF) 100 MCG/2ML IJ SOLN
INTRAMUSCULAR | Status: AC
  Administered 2020-10-01: 50 ug via INTRAVENOUS
  Filled 2020-10-01: qty 2

## 2020-10-01 MED ORDER — ROPIVACAINE HCL 5 MG/ML IJ SOLN
INTRAMUSCULAR | Status: DC | PRN
  Administered 2020-10-01 (×2): 62.5 mg

## 2020-10-01 MED ORDER — HYDROMORPHONE HCL 1 MG/ML IJ SOLN: 0.2500 mg | INTRAMUSCULAR | Status: DC | PRN

## 2020-10-01 MED ORDER — ROPIVACAINE HCL 5 MG/ML IJ SOLN
INTRAMUSCULAR | Status: AC
  Filled 2020-10-01: qty 30

## 2020-10-01 MED ORDER — OXYCODONE HCL 5 MG/5ML PO SOLN: 5.0000 mg | Freq: Once | ORAL | Status: DC | PRN

## 2020-10-01 MED ORDER — MIDAZOLAM HCL 2 MG/2ML IJ SOLN
INTRAMUSCULAR | Status: AC
  Filled 2020-10-01: qty 2

## 2020-10-01 MED ORDER — SODIUM CHLORIDE (PF) 0.9 % IJ SOLN
INTRAMUSCULAR | Status: AC
  Filled 2020-10-01: qty 30

## 2020-10-01 MED ORDER — PROPOFOL 10 MG/ML IV BOLUS
INTRAVENOUS | Status: DC | PRN
  Administered 2020-10-01: 150 mg via INTRAVENOUS

## 2020-10-01 MED ORDER — OXYCODONE HCL 5 MG PO TABS: 5.0000 mg | ORAL_TABLET | ORAL | 0 refills | Status: AC | PRN

## 2020-10-01 MED ORDER — BUPIVACAINE HCL (PF) 0.5 % IJ SOLN
INTRAMUSCULAR | Status: AC
  Filled 2020-10-01: qty 30

## 2020-10-01 MED ORDER — LACTATED RINGERS IV SOLN: INTRAVENOUS | Status: DC | PRN

## 2020-10-01 MED ORDER — PROPOFOL 10 MG/ML IV BOLUS
INTRAVENOUS | Status: AC
  Filled 2020-10-01: qty 20

## 2020-10-01 MED ORDER — HYDROMORPHONE HCL 1 MG/ML IJ SOLN
0.5000 mg | INTRAMUSCULAR | Status: DC | PRN
  Administered 2020-10-01 (×2): 0.5 mg via INTRAVENOUS

## 2020-10-01 MED ORDER — HYDROCODONE-ACETAMINOPHEN 5-325 MG PO TABS
1.0000 | ORAL_TABLET | ORAL | Status: DC | PRN
  Administered 2020-10-01: 2 via ORAL
  Filled 2020-10-01: qty 2

## 2020-10-01 MED ORDER — DIPHENHYDRAMINE HCL 12.5 MG/5ML PO ELIX: 12.5000 mg | ORAL_SOLUTION | ORAL | Status: DC | PRN

## 2020-10-01 MED ORDER — MIDAZOLAM HCL 2 MG/2ML IJ SOLN
INTRAMUSCULAR | Status: AC
  Administered 2020-10-01: 2 mg via INTRAVENOUS
  Filled 2020-10-01: qty 2

## 2020-10-01 MED ORDER — MIDAZOLAM HCL 2 MG/2ML IJ SOLN: 1.0000 mg | INTRAMUSCULAR | Status: DC | PRN

## 2020-10-01 MED ORDER — FENTANYL CITRATE (PF) 100 MCG/2ML IJ SOLN
INTRAMUSCULAR | Status: DC | PRN
  Administered 2020-10-01 (×4): 50 ug via INTRAVENOUS

## 2020-10-01 SURGICAL SUPPLY — 59 items
BIT DRILL 2.8 FIBULA F/ROD (BIT) ×3
BIT DRILL 27 CANN QC (BIT) ×3 IMPLANT
BIT DRILL CANN FIBULA NONSTRL (BIT) ×3
BLADE SURG SZ10 CARB STEEL (BLADE) ×6 IMPLANT
BNDG COHESIVE 4X5 TAN STRL (GAUZE/BANDAGES/DRESSINGS) ×3 IMPLANT
BNDG ELASTIC 4X5.8 VLCR STR LF (GAUZE/BANDAGES/DRESSINGS) ×6 IMPLANT
BNDG ELASTIC 6X5.8 VLCR STR LF (GAUZE/BANDAGES/DRESSINGS) ×3 IMPLANT
BNDG ESMARK 6X12 TAN STRL LF (GAUZE/BANDAGES/DRESSINGS) ×3 IMPLANT
CANISTER SUCT 1200ML W/VALVE (MISCELLANEOUS) ×3 IMPLANT
CHLORAPREP W/TINT 26 (MISCELLANEOUS) ×6 IMPLANT
COVER WAND RF STERILE (DRAPES) ×3 IMPLANT
CUFF TOURN SGL QUICK 18 (TOURNIQUET CUFF) ×3 IMPLANT
CUFF TOURN SGL QUICK 24 (TOURNIQUET CUFF)
CUFF TOURN SGL QUICK 34 (TOURNIQUET CUFF)
CUFF TRNQT CYL 24X4X16.5-23 (TOURNIQUET CUFF) IMPLANT
CUFF TRNQT CYL 34X4.125X (TOURNIQUET CUFF) IMPLANT
DRAPE C-ARM XRAY 36X54 (DRAPES) ×3 IMPLANT
DRAPE C-ARMOR (DRAPES) ×3 IMPLANT
DRAPE INCISE IOBAN 66X45 STRL (DRAPES) ×3 IMPLANT
DRAPE ORTHO SPLIT 77X108 STRL (DRAPES) ×2
DRAPE SURG ORHT 6 SPLT 77X108 (DRAPES) ×1 IMPLANT
DRAPE U-SHAPE 47X51 STRL (DRAPES) ×3 IMPLANT
DRILL CANN 6.1MM INTRA FIB NS (BIT) ×1 IMPLANT
DRILL SURG 2.8 FIBULA F/ROD (BIT) ×1 IMPLANT
ELECT CAUTERY BLADE 6.4 (BLADE) ×3 IMPLANT
ELECT REM PT RETURN 9FT ADLT (ELECTROSURGICAL) ×3
ELECTRODE REM PT RTRN 9FT ADLT (ELECTROSURGICAL) ×1 IMPLANT
GAUZE SPONGE 4X4 12PLY STRL (GAUZE/BANDAGES/DRESSINGS) ×3 IMPLANT
GAUZE XEROFORM 1X8 LF (GAUZE/BANDAGES/DRESSINGS) ×3 IMPLANT
GLOVE SURG ENC MOIS LTX SZ8 (GLOVE) ×6 IMPLANT
GLOVE SURG UNDER LTX SZ8 (GLOVE) ×3 IMPLANT
GOWN STRL REUS W/ TWL LRG LVL3 (GOWN DISPOSABLE) ×1 IMPLANT
GOWN STRL REUS W/ TWL XL LVL3 (GOWN DISPOSABLE) ×1 IMPLANT
GOWN STRL REUS W/TWL LRG LVL3 (GOWN DISPOSABLE) ×2
GOWN STRL REUS W/TWL XL LVL3 (GOWN DISPOSABLE) ×2
GUIDEWIRE ORTH 6X062XTROC NS (WIRE) ×1 IMPLANT
GUIDEWIRE ORTHO 1.3X150 NT (WIRE) ×3 IMPLANT
K-WIRE .062 (WIRE) ×2
KIT REPAIR W/O DRILL ANKLE (Ankle) ×3 IMPLANT
KIT TURNOVER KIT A (KITS) ×3 IMPLANT
LABEL OR SOLS (LABEL) ×3 IMPLANT
MANIFOLD NEPTUNE II (INSTRUMENTS) ×3 IMPLANT
NS IRRIG 1000ML POUR BTL (IV SOLUTION) ×3 IMPLANT
PACK EXTREMITY ARMC (MISCELLANEOUS) ×3 IMPLANT
PAD ABD DERMACEA PRESS 5X9 (GAUZE/BANDAGES/DRESSINGS) ×6 IMPLANT
PAD CAST CTTN 4X4 STRL (SOFTGOODS) ×2 IMPLANT
PAD PREP 24X41 OB/GYN DISP (PERSONAL CARE ITEMS) ×3 IMPLANT
PADDING CAST COTTON 4X4 STRL (SOFTGOODS) ×4
ROD 3.0 X 180MM SM BONE (Nail) ×3 IMPLANT
SCREW CANN PT 4X40 (Screw) ×3 IMPLANT
SCREW NON LOCKING HEX 3.5X22 (Screw) ×6 IMPLANT
SPLINT CAST 1 STEP 4X30 (MISCELLANEOUS) ×6 IMPLANT
SPONGE LAP 18X18 RF (DISPOSABLE) ×3 IMPLANT
STAPLER SKIN PROX 35W (STAPLE) ×3 IMPLANT
STOCKINETTE IMPERV 14X48 (MISCELLANEOUS) ×3 IMPLANT
SUT VIC AB 0 CT1 36 (SUTURE) ×3 IMPLANT
SUT VIC AB 2-0 SH 27 (SUTURE) ×4
SUT VIC AB 2-0 SH 27XBRD (SUTURE) ×2 IMPLANT
SYR 10ML LL (SYRINGE) ×3 IMPLANT

## 2020-10-01 NOTE — Progress Notes (Addendum)
Narcotic pain prescription given to wife jami so that she can fill prior to pt discharge.

## 2020-10-01 NOTE — Anesthesia Procedure Notes (Signed)
Anesthesia Regional Block: Popliteal block   Pre-Anesthetic Checklist: , timeout performed,  Correct Patient, Correct Site, Correct Laterality,  Correct Procedure, Correct Position, site marked,  Risks and benefits discussed,  Surgical consent,  Pre-op evaluation,  At surgeon's request and post-op pain management  Laterality: Lower and Left  Prep: chloraprep       Needles:  Injection technique: Single-shot  Needle Type: Echogenic Needle     Needle Length: 9cm  Needle Gauge: 21     Additional Needles:   Procedures:,,,, ultrasound used (permanent image in chart),,    Narrative:  Start time: 10/01/2020 11:35 AM End time: 10/01/2020 11:37 AM Injection made incrementally with aspirations every 5 mL.  Performed by: Personally  Anesthesiologist: Corinda Gubler, MD  Additional Notes: Patient's chart reviewed and they were deemed appropriate candidate for procedure, at surgeon's request. Patient educated about risks, benefits, and alternatives of the block including but not limited to: temporary or permanent nerve damage, bleeding, infection, damage to surround tissues, block failure, local anesthetic toxicity. Patient expressed understanding. A formal time-out was conducted consistent with institution rules.  Monitors were applied, and minimal sedation used. The site was prepped with skin prep and allowed to dry, and sterile gloves were used. A high frequency linear ultrasound probe with probe cover was utilized throughout. Popliteal artery pulsatile and visualized in popliteal fossa along with adjacent sciatic nerve and its branch point, which appeared anatomically normal, local anesthetic injected around them just proximal to the branch point, and echogenic block needle trajectory was monitored throughout. Aspiration performed every 36ml. Blood vessels were avoided. All injections were performed without resistance and free of blood and paresthesias. The patient tolerated the procedure  well.  Injectate: 43ml 0.25% ropivacaine + 2mg  decadron

## 2020-10-01 NOTE — H&P (Signed)
Subjective:  Chief complaint: Left ankle pain.  The patient is a 25 y.o. male who sustained an injury to the left ankle last evening. Apparently he stepped into a hole while trying to chase a possum out of his chicken coop. The patient was brought to the emergency room where x-rays demonstrated the above-noted injury. The patient's ankle was reduced by the ER provider and placed into a posterior splint with sugar-tong supplement. The patient is being admitted at this time in preparation for definitive management of this injury. The patient denies any associated injury. The patient did not strike his head or lose consciousness. The patient also denies any light-headedness, dizziness, chest pain, or shortness of breath which might have contributed to the injury.  Patient Active Problem List   Diagnosis Date Noted   Ankle fracture, left 10/01/2020   History reviewed. No pertinent past medical history.  History reviewed. No pertinent surgical history.  Medications Prior to Admission  Medication Sig Dispense Refill Last Dose   Loratadine 10 MG CAPS Take 10 mg by mouth daily.   09/30/2020   No Known Allergies  Social History   Tobacco Use   Smoking status: Never   Smokeless tobacco: Current    Types: Chew  Substance Use Topics   Alcohol use: No    History reviewed. No pertinent family history.   Review of Systems: As noted above. The patient denies any chest pain, shortness of breath, nausea, vomiting, diarrhea, constipation, belly pain, blood in his/her stool, or burning with urination.  Objective: Temp:  [98.1 F (36.7 C)-98.7 F (37.1 C)] 98.1 F (36.7 C) (06/21 0943) Pulse Rate:  [56-105] 72 (06/21 0943) Resp:  [11-30] 18 (06/21 0943) BP: (108-145)/(64-98) 145/90 (06/21 0943) SpO2:  [97 %-100 %] 98 % (06/21 0943) Weight:  [68 kg] 68 kg (06/21 0943)  Physical Exam: General:  Alert, no acute distress Psychiatric:  Patient is competent for consent with normal mood and affect  Cardiovascular:  RRR  Respiratory:  Clear to auscultation. No wheezing. Non-labored breathing GI:  Abdomen is soft and non-tender Skin:  No lesions in the area of chief complaint Neurologic:  Sensation intact distally Lymphatic:  No axillary or cervical lymphadenopathy  Orthopedic Exam:  Orthopedic examination is limited to the left foot and lower leg. The patient is in a posterior splint with a sugar-tong supplement. The splint appears to be fitting well. The skin is intact at the proximal and distal margins of the splint.  He is able to dorsiflex and plantarflex his toes. Sensation is intact to light touch to all distributions.  He has good capillary refill to all digits.  Imaging Review: Recent pre and postreduction x-rays of the left ankle are available for review and have been reviewed by myself. These films demonstrate a trimalleolar fracture dislocation with a very small posterior malleolar fragment but a long spiral comminuted fibular fracture. The postreduction views show excellent reduction of the fractures. No lytic lesions or significant degenerative changes are identified.  Assessment: Closed trimalleolar fracture dislocation, left ankle.  Plan: The treatment options, including both surgical and nonsurgical choices, have been discussed in detail with the patient and his significant other. The patient would like to proceed with surgical intervention to include an open reduction and internal fixation of the medial and lateral malleolar fractures of the left ankle. The procedure has been discussed in detail, as have the potential risks (including bleeding, infection, nerve and/or blood vessel injury, persistent or recurrent pain, loosening or failure of the  components, leg length inequality, dislocation, need for further surgery, blood clots, strokes, heart attacks or arrhythmias, pneumonia, etc.) and benefits of the surgical procedure. The patient states his understanding and agrees to  proceed. A formal written consent will be obtained by the nursing staff.

## 2020-10-01 NOTE — Anesthesia Procedure Notes (Signed)
Anesthesia Regional Block: Adductor canal block   Pre-Anesthetic Checklist: , timeout performed,  Correct Patient, Correct Site, Correct Laterality,  Correct Procedure, Correct Position, site marked,  Risks and benefits discussed,  Surgical consent,  Pre-op evaluation,  At surgeon's request and post-op pain management  Laterality: Lower and Left  Prep: chloraprep       Needles:  Injection technique: Single-shot  Needle Type: Echogenic Needle     Needle Length: 9cm  Needle Gauge: 21     Additional Needles:   Procedures:,,,, ultrasound used (permanent image in chart),,    Narrative:  Start time: 10/01/2020 11:37 AM End time: 10/01/2020 11:39 AM Injection made incrementally with aspirations every 5 mL.  Performed by: Personally  Anesthesiologist: Corinda Gubler, MD  Additional Notes: Patient's chart reviewed and they were deemed appropriate candidate for procedure, per surgeon's request. Patient educated about risks, benefits, and alternatives of the block including but not limited to: temporary or permanent nerve damage, bleeding, infection, damage to surround tissues, block failure, local anesthetic toxicity. Patient expressed understanding. A formal time-out was conducted consistent with institution rules.  Monitors were applied, and minimal sedation used (see nursing record). The site was prepped with skin prep and allowed to dry, and sterile gloves were used. A high frequency linear ultrasound probe with probe cover was utilized throughout. Femoral artery visualized at mid-thigh level, local anesthetic injected anterolateral to it, and echogenic block needle trajectory was monitored throughout. Hydrodissection of saphenous nerve visualized and appeared anatomically normal. Aspiration performed every 27ml. Blood vessels were avoided. All injections were performed without resistance and free of blood and paresthesias. The patient tolerated the procedure well.  Injectate: 33ml 0.25%  ropivacaine + 2mg  decadron

## 2020-10-01 NOTE — Progress Notes (Signed)
PT Contact Note  Patient Details Name: Mark Turner MRN: [id number] DOB: [birth year]   Cancelled Treatment:    Reason Eval/Treat Not Completed: Fatigue/lethargy limiting ability to participate Pt sedated heading into surgery for L ankle fx.  Spoke with wife extensively about equipment use and set up, management of transfers/mobility/steps/etc.  Answered questions regarding NWBing, bathing, expected course of recovery, etc.    Malachi Pro, DPT 10/01/2020, 4:25 PM

## 2020-10-01 NOTE — Anesthesia Preprocedure Evaluation (Signed)
Anesthesia Evaluation  Patient identified by MRN, date of birth, ID band Patient awake    Reviewed: Allergy & Precautions, NPO status , Patient's Chart, lab work & pertinent test results  History of Anesthesia Complications Negative for: history of anesthetic complications  Airway Mallampati: II  TM Distance: >3 FB Neck ROM: Full    Dental no notable dental hx. (+) Dental Advisory Given   Pulmonary asthma (mild intermittent; last albuterol use 1 month ago) ,   Allergic rhinitis   Pulmonary exam normal breath sounds clear to auscultation       Cardiovascular Exercise Tolerance: Good negative cardio ROS Normal cardiovascular exam Rhythm:Regular Rate:Normal   METs >4; denies CP or SOB w/ exertion   Neuro/Psych negative neurological ROS  negative psych ROS   GI/Hepatic negative GI ROS, Neg liver ROS,   Endo/Other  negative endocrine ROS  Renal/GU negative Renal ROS  negative genitourinary   Musculoskeletal  L ankle fx from fall in yard, p/f ORIF   Abdominal (+) - obese (BMI 23),   Peds negative pediatric ROS (+)  Hematology negative hematology ROS (+)   Anesthesia Other Findings   Reproductive/Obstetrics negative OB ROS                             Anesthesia Physical Anesthesia Plan  ASA: 2  Anesthesia Plan: General and Regional   Post-op Pain Management: GA combined w/ Regional for post-op pain   Induction: Intravenous  PONV Risk Score and Plan: Ondansetron and Dexamethasone  Airway Management Planned: Oral ETT  Additional Equipment: None  Intra-op Plan:   Post-operative Plan: Extubation in OR  Informed Consent: I have reviewed the patients History and Physical, chart, labs and discussed the procedure including the risks, benefits and alternatives for the proposed anesthesia with the patient or authorized representative who has indicated his/her understanding and  acceptance.     Dental advisory given  Plan Discussed with: Anesthesiologist, CRNA and Surgeon  Anesthesia Plan Comments: (Plan for popliteal/saph block for post-op pain control. Patient amenable. Plan & risks/side effects discussed w/ patient, wife & mother.)        Anesthesia Quick Evaluation

## 2020-10-01 NOTE — Transfer of Care (Signed)
Immediate Anesthesia Transfer of Care Note  Patient: Mark Turner  Procedure(s) Performed: OPEN REDUCTION INTERNAL FIXATION (ORIF) ANKLE FRACTURE (Left: Ankle)  Patient Location: PACU  Anesthesia Type:General  Level of Consciousness: sedated  Airway & Oxygen Therapy: Patient Spontanous Breathing and Patient connected to face mask oxygen  Post-op Assessment: Report given to RN and Post -op Vital signs reviewed and stable  Post vital signs: Reviewed and stable  Last Vitals:  Vitals Value Taken Time  BP    Temp    Pulse    Resp    SpO2      Last Pain:  Vitals:   10/01/20 1400  TempSrc: Tympanic  PainSc: 10-Worst pain ever         Complications: No notable events documented.

## 2020-10-01 NOTE — Op Note (Signed)
10/01/2020  6:29 PM  Patient:   Mark Turner  Pre-Op Diagnosis:   Closed displaced trimalleolar fracture dislocation, left ankle.  Post-Op Diagnosis:   Same.  Procedure:   Open reduction and internal fixation of displaced trimalleolar fracture dislocation, left ankle.  Surgeon:   Maryagnes Amos, MD  Assistant:   None  Anesthesia:   General LMA with popliteal block placed preoperatively by the anesthesiologist  Findings:   As above.  Complications:   None  EBL:   20 cc  Fluids:   600 cc crystalloid  UOP:   None  TT:   71 min at 250 mmHg  Drains:   None  Closure:   Staples  Implants:   Acumed 3 x 180 mm fibular nail, syndesmotic tight rope, and 40 mm 4.0 partially-threaded cancellous screw.  Brief Clinical Note:   The patient is a 25 year old male who sustained above-noted injury last evening when he apparently stepped into a hole while trying to chase down a possum in his chicken coop. He presented to the emergency room where x-rays demonstrated the above-noted injury. The patient presents at this time for definitive management of his injury.  Procedure:   The patient underwent placement of a popliteal block in the preoperative holding area before he was brought into the operating room and lain in the supine position. After adequate general laryngeal mask anesthesia was obtained, the left foot and lower leg were prepped with ChloraPrep solution, then draped sterilely. Preoperative antibiotics were administered. A timeout was performed to verify the appropriate surgical site before the limb was exsanguinated with an Esmarch and the calf tourniquet inflated to 250 mmHg.   Laterally, a 3-4 cm incision was made over the lateral aspect of the distal fibula centered at the tip of the fibula. The incision was carried down through the subcutaneous tissues to expose the distal tip of the fibula. Under fluoroscopic guidance, a guidewire was passed up through the intramedullary canal  of the distal fibula, then overreamed with a 5 mm reamer. The 3.1 reamer was then passed up through the fibular canal with care taken to avoid displacing the many fracture fragments. After verifying the position of the reamer fluoroscopically in AP and lateral projections, the reamer was removed and a 3 x 180 mm fibular nail introduced and advanced to the appropriate position, again confirmed fluoroscopically in AP and lateral projections.   Using the appropriate guide, two anterior to posteriorly directed screws of the appropriate length were placed through the distal fibula to secure the nail.  Again the position of the hardware and fractures was verified fluoroscopically in AP and lateral projections and found to be excellent.  Attention was directed to the medial side. An approximately 3-4 cm longitudinal incision was made centered over the tip of the medial malleolus. This incision also was carried down through the subcutaneous tissues to expose the fracture site. Care was taken to identify and protect the saphenous nerve and vein. The fracture hematoma was removed before the fracture was reduced. A single guidewire was placed obliquely across the fracture from distal to proximal into the distal tibial metaphysis. After verifying its position fluoroscopically, the guidewire was sequentially over-reamed and replaced with a 40 mm partially threaded 4.0 cancellous screw in lag fashion. Again the adequacy of fracture reduction, hardware position, and mortise restoration was verified in AP, lateral, and oblique projections and found to be excellent.  Given how small the medial fragment was, it was felt best to not put  in a second screw.  Attention was redirected to the lateral side.  Under fluoroscopic guidance, a single syndesmotic tight rope was placed from lateral to medial after drilling the appropriate holes through the appropriate guide.  The tight rope was deployed then tightened securely to stabilize  the syndesmosis.  This was verified fluoroscopically by attempting to displace the syndesmosis with external rotation.  No syndesmotic displacement or medial joint space widening was noted.  Each wound was copiously irrigated with sterile saline solution. Laterally, the subcutaneous tissues were closed using 2-0 Vicryl interrupted sutures before the skin was closed using staples. Medially, the subcutaneous tissues were closed using 2-0 Vicryl interrupted sutures before the skin was closed using staples.  The stab incisions also were closed using staples. Sterile bulky dressings were applied to the wounds before the patient was placed into a posterior splint with a sugar tong supplement, maintaining the ankle in neutral dorsiflexion. The patient was then awakened, extubated, and returned to the recovery room in satisfactory condition after tolerating the procedure well.

## 2020-10-01 NOTE — Anesthesia Postprocedure Evaluation (Signed)
Anesthesia Post Note  Patient: Mark Turner  Procedure(s) Performed: OPEN REDUCTION INTERNAL FIXATION (ORIF) ANKLE FRACTURE (Left: Ankle)  Patient location during evaluation: PACU Anesthesia Type: Regional and General Level of consciousness: awake and alert Pain management: pain level controlled Vital Signs Assessment: post-procedure vital signs reviewed and stable Respiratory status: spontaneous breathing and respiratory function stable Cardiovascular status: stable Anesthetic complications: no   No notable events documented.   Last Vitals:  Vitals:   10/01/20 1830 10/01/20 1845  BP: (!) 97/48 (!) 99/48  Pulse: 65 65  Resp: 10 (!) 9  Temp:    SpO2: 99% 99%    Last Pain:  Vitals:   10/01/20 1826  TempSrc:   PainSc: Asleep                 Marquisa Salih K

## 2020-10-01 NOTE — Discharge Instructions (Addendum)
Orthopedic discharge instructions: Keep splint dry and intact. Keep foot elevated on several pillows above heart level  Apply ice frequently to ankle. Take ibuprofen 600-800 mg TID with meals for 7-10 days, then as necessary. Take oxycodone as prescribed when needed.  May supplement with ES Tylenol if necessary. No weight-bear on left foot - use crutches for ambulation. Follow-up in 10-14 days or as scheduled.   AMBULATORY SURGERY  DISCHARGE INSTRUCTIONS   The drugs that you were given will stay in your system until tomorrow so for the next 24 hours you should not:  Drive an automobile Make any legal decisions Drink any alcoholic beverage   You may resume regular meals tomorrow.  Today it is better to start with liquids and gradually work up to solid foods.  You may eat anything you prefer, but it is better to start with liquids, then soup and crackers, and gradually work up to solid foods.   Please notify your doctor immediately if you have any unusual bleeding, trouble breathing, redness and pain at the surgery site, drainage, fever, or pain not relieved by medication.    Your post-operative visit with Dr.                                       is: Date:                        Time:    Please call to schedule your post-operative visit.  Additional Instructions:

## 2020-10-01 NOTE — Anesthesia Procedure Notes (Signed)
Procedure Name: LMA Insertion Date/Time: 10/01/2020 4:45 PM Performed by: Junious Silk, CRNA Pre-anesthesia Checklist: Patient identified, Patient being monitored, Timeout performed, Emergency Drugs available and Suction available Patient Re-evaluated:Patient Re-evaluated prior to induction Oxygen Delivery Method: Circle system utilized Preoxygenation: Pre-oxygenation with 100% oxygen Induction Type: IV induction Ventilation: Mask ventilation without difficulty LMA: LMA inserted LMA Size: 4.0 Tube type: Oral Number of attempts: 1 Placement Confirmation: positive ETCO2 and breath sounds checked- equal and bilateral Tube secured with: Tape Dental Injury: Teeth and Oropharynx as per pre-operative assessment

## 2020-10-02 ENCOUNTER — Encounter: Payer: Self-pay | Admitting: Surgery

## 2020-10-02 MED ORDER — ONDANSETRON HCL 4 MG PO TABS: 4.0000 mg | ORAL_TABLET | Freq: Four times a day (QID) | ORAL | 0 refills | Status: AC | PRN

## 2020-10-02 NOTE — Progress Notes (Signed)
Pt d/c home via POV with all personal belongings.  D/c paperwork reviewed with pt and mother at bedside, both expressed understanding.  Crutches and BSC were taken at time of d/c.  IV removed from L AC without issue.  NAD noted at d/c.  Pt assisted to personal vehicle at med mall ent.

## 2020-10-02 NOTE — Evaluation (Signed)
Physical Therapy Evaluation Patient Details Name: Mark Turner MRN: [id number] DOB: [birth year] Today's Date: 10/02/2020   History of Present Illness  25 y/o male with L ankle fx, now s/p ORIF 6/21.  Clinical Impression  Pt did well with gait training/stair negotiation.  He was safe with crutches and overall did well, HR did get up to 130s with 200 ft of ambulation.  No issues with mobility, transfers; good understanding of setting crutches and appropriate use.  No LOBs over overt safety issues, safe to manage stairs and in-home mobility.      Follow Up Recommendations Follow surgeon's recommendation for DC plan and follow-up therapies    Equipment Recommendations  Crutches    Recommendations for Other Services       Precautions / Restrictions Precautions Precautions: Fall Restrictions Weight Bearing Restrictions: Yes LLE Weight Bearing: Non weight bearing      Mobility  Bed Mobility Overal bed mobility: Independent                  Transfers Overall transfer level: Modified independent Equipment used: Crutches             General transfer comment: Instructed on appropriate transition to/from standing strategies, pt able to execute each w/o issue, minimal reminders on first effort  Ambulation/Gait Ambulation/Gait assistance: Supervision Gait Distance (Feet): 200 Feet Assistive device: Crutches       General Gait Details: Pt with only minimal hesitancy on the first few hop-to steps, quickly showed increased confidence and comfort and was able to confidently ambulation around the hallways.  Pt's HR did get to the 130s with the effort with some subjective fatigue but no overt safety issues.  Stairs Stairs: Yes Stairs assistance: Supervision Stair Management: One rail Left;Two rails;Backwards;With crutches Number of Stairs: 12 General stair comments: 3 sperate strategies with stair negotiation; pt able to perform each safely.  As expected most confident  with b/l rails (backward) but able to negotiate with crutches and with single rail.  Pt confident with multiple strategies and able to manage each w/o physical assist.  Wheelchair Mobility    Modified Rankin (Stroke Patients Only)       Balance Overall balance assessment: Modified Independent                                           Pertinent Vitals/Pain Pain Assessment: 0-10 Pain Score: 5  Pain Location: L ankle    Home Living Family/patient expects to be discharged to:: Private residence Living Arrangements: Spouse/significant other (will probably d/c to mother's home for a few days) Available Help at Discharge: Available 24 hours/day   Home Access: Stairs to enter Entrance Stairs-Rails: Can reach both (3 steps w/o rail at his home, 4 steps with rails at mother's) Entrance Stairs-Number of Steps: 4 Home Layout: One level Home Equipment: Hand held shower head      Prior Function Level of Independence: Independent         Comments: pt independent and active, drives truck     Hand Dominance        Extremity/Trunk Assessment   Upper Extremity Assessment Upper Extremity Assessment: Overall WFL for tasks assessed    Lower Extremity Assessment Lower Extremity Assessment: Overall WFL for tasks assessed (expected L post-op limiations)       Communication   Communication: No difficulties  Cognition Arousal/Alertness: Awake/alert Behavior During Therapy: [facility]  for tasks assessed/performed Overall Cognitive Status: Within Functional Limits for tasks assessed                                        General Comments      Exercises     Assessment/Plan    PT Assessment Patient needs continued PT services  PT Problem List Decreased strength;Decreased range of motion;Decreased activity tolerance;Decreased balance;Decreased mobility;Decreased coordination;Decreased knowledge of use of DME;Decreased safety awareness       PT  Treatment Interventions DME instruction;Gait training;Stair training;Functional mobility training;Therapeutic activities;Therapeutic exercise;Balance training;Patient/family education    PT Goals (Current goals can be found in the Care Plan section)  Acute Rehab PT Goals Patient Stated Goal: go home PT Goal Formulation: With patient Potential to Achieve Goals: Good    Frequency 7X/week   Barriers to discharge        Co-evaluation               AM-PAC PT "6 Clicks" Mobility  Outcome Measure Help needed turning from your back to your side while in a flat bed without using bedrails?: None Help needed moving from lying on your back to sitting on the side of a flat bed without using bedrails?: None Help needed moving to and from a bed to a chair (including a wheelchair)?: None Help needed standing up from a chair using your arms (e.g., wheelchair or bedside chair)?: None Help needed to walk in hospital room?: None Help needed climbing 3-5 steps with a railing? : A Little 6 Click Score: 23    End of Session Equipment Utilized During Treatment: Gait belt Activity Tolerance: Patient tolerated treatment well Patient left: with chair alarm set;with call bell/phone within reach;with family/visitor present Nurse Communication: Mobility status PT Visit Diagnosis: Muscle weakness (generalized) (M62.81);Difficulty in walking, not elsewhere classified (R26.2)    Time: 0820-0910 PT Time Calculation (min) (ACUTE ONLY): 50 min   Charges:   PT Evaluation $PT Eval Low Complexity: 1 Low PT Treatments $Gait Training: 23-37 mins        Malachi Pro, DPT 10/02/2020, 10:34 AM

## 2020-10-02 NOTE — Discharge Summary (Signed)
Physician Discharge Summary  Patient ID: Mark Turner MRN: [id number] DOB/AGE: [birth year] 24 y.o.  Admit date: 09/30/2020 Discharge date: 10/02/2020  Admission Diagnoses:  Ankle fracture, left [S82.892A] Closed fracture of left ankle, initial encounter [S82.892A] Ankle dislocation, left, initial encounter [S93.05XA]   Discharge Diagnoses: Patient Active Problem List   Diagnosis Date Noted   Ankle fracture, left 10/01/2020    History reviewed. No pertinent past medical history.   Transfusion: None.   Consultants (if any):   Discharged Condition: Improved  Hospital Course: Mark Turner is an 25 y.o. male who was admitted 09/30/2020 with a diagnosis of  a closed, displaced trimalleolar fracture dislocation of the left ankle and went to the operating room on 10/01/2020 and underwent the above named procedures.    Surgeries: Procedure(s): OPEN REDUCTION INTERNAL FIXATION (ORIF) ANKLE FRACTURE on 10/01/2020 Patient tolerated the surgery well. Taken to PACU where she was stabilized and then transferred to the orthopedic floor.  Started on Lovenox 40mg  q 24 hrs. Foot pumps applied bilaterally at 80 mm. Heels elevated on bed with rolled towels. No evidence of DVT. Negative Homan. Physical therapy started on day #1 for gait training and transfer. OT started day #1 for ADL and assisted devices.  Patient's IV was removed on POD1.  Implants: Acumed 3 x 180 mm fibular nail, syndesmotic tight rope, and 40 mm 4.0 partially-threaded cancellous screw.  He was given perioperative antibiotics:  Anti-infectives (From admission, onward)    Start     Dose/Rate Route Frequency Ordered Stop   10/01/20 2200  ceFAZolin (ANCEF) IVPB 2g/100 mL premix        2 g 200 mL/hr over 30 Minutes Intravenous Every 6 hours 10/01/20 2034 10/02/20 1559   10/01/20 1530  ceFAZolin (ANCEF) IVPB 2g/100 mL premix  Status:  Discontinued        2 g 200 mL/hr over 30 Minutes Intravenous  Once 10/01/20 0057  10/01/20 2021   10/01/20 0953  ceFAZolin (ANCEF) 2-4 GM/100ML-% IVPB       Note to Pharmacy: 10/03/20   : cabinet override      10/01/20 0953 10/01/20 2159     .  He was given sequential compression devices, early ambulation, and Lovenox for DVT prophylaxis.  He benefited maximally from the hospital stay and there were no complications.    Recent vital signs:  Vitals:   10/01/20 2110 10/02/20 0452  BP: 122/67 107/64  Pulse: 99 75  Resp: 16 16  Temp: 97.9 F (36.6 C) 97.9 F (36.6 C)  SpO2: 97% 98%    Recent laboratory studies:  Lab Results  Component Value Date   HGB 14.2 10/01/2020   HGB 16.1 07/03/2016   HGB 15.8 05/22/2015   Lab Results  Component Value Date   WBC 11.0 (H) 10/01/2020   PLT 218 10/01/2020   No results found for: INR Lab Results  Component Value Date   NA 140 10/01/2020   K 4.4 10/01/2020   CL 104 10/01/2020   CO2 30 10/01/2020   BUN 16 10/01/2020   CREATININE 0.98 10/01/2020   GLUCOSE 119 (H) 10/01/2020   Discharge Medications:   Allergies as of 10/02/2020   No Known Allergies      Medication List     TAKE these medications    Loratadine 10 MG Caps Take 10 mg by mouth daily.   ondansetron 4 MG tablet Commonly known as: ZOFRAN Take 1 tablet (4 mg total) by mouth every 6 (six) hours as  needed for nausea.   oxyCODONE 5 MG immediate release tablet Commonly known as: Roxicodone Take 1-2 tablets (5-10 mg total) by mouth every 4 (four) hours as needed for moderate pain or severe pain.               Durable Medical Equipment  (From admission, onward)           Start     Ordered   10/01/20 1601  For home use only DME Walker rolling  Once       Question Answer Comment  Walker: With 5 Inch Wheels   Patient needs a walker to treat with the following condition Ankle fracture, left      10/01/20 1600           Diagnostic Studies: DG Ankle 2 Views Left  Result Date: 10/01/2020 CLINICAL DATA:  24 year old  male status post ORIF of the left ankle fracture. EXAM: LEFT ANKLE - 2 VIEW; DG C-ARM 1-60 MIN COMPARISON:  None. FINDINGS: Five intraoperative fluoroscopic spot images provided. The total fluoroscopic time is 1 minutes 1 second. There is fixation screw through the medial malleolus and an intramedullary nail through the distal fibular fracture. IMPRESSION: Fluoroscopic spot images of the left ankle. Electronically Signed   By: Elgie Collard M.D.   On: 10/01/2020 18:23   DG Ankle 2 Views Left  Result Date: 10/01/2020 CLINICAL DATA:  Post reduction EXAM: LEFT ANKLE - 2 VIEW COMPARISON:  X-ray left ankle 09/30/2020 FINDINGS: Status post cast placement with improved anatomical alignment of a trimalleolar fracture. Interval reduction of the left ankle joint with proper alignment of the tibiotalar joint and tibia fibular joint. Persistent widening of the medial clear space. Subcutaneus soft tissue edema. IMPRESSION: Improved anatomical alignment of a trimalleolar fracture. Interval reduction of the left ankle joint with proper alignment of the tibiotalar joint and tibia-fibular joint but persistent widening of the medial clear space. Electronically Signed   By: Tish Frederickson M.D.   On: 10/01/2020 01:07   DG Ankle Complete Left  Result Date: 09/30/2020 CLINICAL DATA:  Larey Seat into a hole.  Felt a pop.  Deformity. EXAM: LEFT ANKLE COMPLETE - 3+ VIEW COMPARISON:  None. FINDINGS: Lateral dislocation of the talus with respect to the tibia. There may also be a rotational component. Displaced medial malleolar fracture with distal fracture fragment aligned with the displaced talus. There is an angulated distal fibular shaft fracture. Disruption of the ankle mortise and syndesmosis. No visualized tibial tubercle fracture, however significant osseous overlap. Generalized soft tissue edema at the fracture site. IMPRESSION: Ankle fracture dislocation. Displaced medial malleolar and distal fibular fractures with disruption  of the ankle mortise and syndesmosis. Lateral dislocation of the talus with respect to the tibia. Electronically Signed   By: Narda Rutherford M.D.   On: 09/30/2020 23:52   DG C-Arm 1-60 Min  Result Date: 10/01/2020 CLINICAL DATA:  25 year old male status post ORIF of the left ankle fracture. EXAM: LEFT ANKLE - 2 VIEW; DG C-ARM 1-60 MIN COMPARISON:  None. FINDINGS: Five intraoperative fluoroscopic spot images provided. The total fluoroscopic time is 1 minutes 1 second. There is fixation screw through the medial malleolus and an intramedullary nail through the distal fibular fracture. IMPRESSION: Fluoroscopic spot images of the left ankle. Electronically Signed   By: Elgie Collard M.D.   On: 10/01/2020 18:23   DG C-Arm 1-60 Min  Result Date: 10/01/2020 CLINICAL DATA:  25 year old male status post ORIF of the left ankle fracture. EXAM: LEFT ANKLE -  2 VIEW; DG C-ARM 1-60 MIN COMPARISON:  None. FINDINGS: Five intraoperative fluoroscopic spot images provided. The total fluoroscopic time is 1 minutes 1 second. There is fixation screw through the medial malleolus and an intramedullary nail through the distal fibular fracture. IMPRESSION: Fluoroscopic spot images of the left ankle. Electronically Signed   By: Elgie Collard M.D.   On: 10/01/2020 18:23   [place] OR NERVE BLOCK-IMAGE ONLY [facility])  Result Date: 10/01/2020 There is no interpretation for this exam.  This order is for images obtained during a surgical procedure.  Please See "Surgeries" Tab for more information regarding the procedure.    Disposition: Discharge disposition: 01-Home or Self Care       Follow-up Information     Poggi, Excell Seltzer, MD Follow up.   Specialty: Orthopedic Surgery Why: call office to schedule follow up appointment in 10-14 days Contact information: [address] [facility] [place] [address] [phone]                Signed: Meriel Pica PA-C 10/02/2020, 7:57 AM

## 2020-10-02 NOTE — Plan of Care (Signed)

## 2020-10-02 NOTE — Progress Notes (Signed)
  Subjective: 1 Day Post-Op Procedure(s) (LRB): OPEN REDUCTION INTERNAL FIXATION (ORIF) ANKLE FRACTURE (Left) Patient reports pain as mild.   Patient is well, and has had no acute complaints or problems Plan is to go Home after hospital stay. Negative for chest pain and shortness of breath Fever: no Gastrointestinal:Negative for nausea and vomiting  Objective: Vital signs in last 24 hours: Temp:  [97.1 F (36.2 C)-98.3 F (36.8 C)] 97.9 F (36.6 C) (06/22 0452) Pulse Rate:  [63-99] 75 (06/22 0452) Resp:  [9-18] 16 (06/22 0452) BP: (89-145)/(48-90) 107/64 (06/22 0452) SpO2:  [97 %-100 %] 98 % (06/22 0452) Weight:  [68 kg] 68 kg (06/21 0943)  Intake/Output from previous day:  Intake/Output Summary (Last 24 hours) at 10/02/2020 0753 Last data filed at 10/02/2020 0200 Gross per 24 hour  Intake 1070 ml  Output 1120 ml  Net -50 ml    Intake/Output this shift: No intake/output data recorded.  Labs: Recent Labs    10/01/20 0202  HGB 14.2   Recent Labs    10/01/20 0202  WBC 11.0*  RBC 4.69  HCT 41.4  PLT 218   Recent Labs    10/01/20 0202  NA 140  K 4.4  CL 104  CO2 30  BUN 16  CREATININE 0.98  GLUCOSE 119*  CALCIUM 8.5*   No results for input(s): LABPT, INR in the last 72 hours.   EXAM General - Patient is Alert, Appropriate, and Oriented Extremity - ABD soft Sensation intact distally Short leg splint intact to the left leg. Patient able to dorsiflex and plantarflex toes. Intact to light touch to the left dorsal and volar aspect of toes. Intact to light touch to the proximal aspect of the short leg splint.  History reviewed. No pertinent past medical history.  Assessment/Plan: 1 Day Post-Op Procedure(s) (LRB): OPEN REDUCTION INTERNAL FIXATION (ORIF) ANKLE FRACTURE (Left) Active Problems:   Ankle fracture, left  Estimated body mass index is 23.48 kg/m as calculated from the following:   Height as of this encounter: 5\' 7"  (1.702 m).   Weight as  of this encounter: 68 kg. Advance diet Up with therapy D/C IV fluids when tolerating po intake.  Vitals stable this AM. No significant pain. Patient is passing gas. Plan for d/c home this afternoon after working with PT.  DVT Prophylaxis - Lovenox and TED hose Non-weightbearing to the left leg.  , PA-C [facility] Orthopaedic Surgery 10/02/2020, 7:53 AM
